# Patient Record
Sex: Male | Born: 1950 | Race: Black or African American | Hispanic: No | Marital: Married | Smoking: Never smoker
Health system: Southern US, Community
[De-identification: ages and names within clinical notes are randomized; demographics above are authoritative.]

## PROBLEM LIST (undated history)

## (undated) DIAGNOSIS — I10 Essential (primary) hypertension: Secondary | ICD-10-CM

## (undated) DIAGNOSIS — E119 Type 2 diabetes mellitus without complications: Secondary | ICD-10-CM

## (undated) DIAGNOSIS — G8929 Other chronic pain: Secondary | ICD-10-CM

## (undated) DIAGNOSIS — M549 Dorsalgia, unspecified: Secondary | ICD-10-CM

## (undated) DIAGNOSIS — M1990 Unspecified osteoarthritis, unspecified site: Secondary | ICD-10-CM

---

## 2003-11-15 ENCOUNTER — Emergency Department (HOSPITAL_COMMUNITY): Admission: EM | Admit: 2003-11-15 | Discharge: 2003-11-15 | Payer: Self-pay | Admitting: Emergency Medicine

## 2003-11-16 ENCOUNTER — Emergency Department (HOSPITAL_COMMUNITY): Admission: EM | Admit: 2003-11-16 | Discharge: 2003-11-16 | Payer: Self-pay | Admitting: Emergency Medicine

## 2010-10-02 ENCOUNTER — Inpatient Hospital Stay (HOSPITAL_COMMUNITY)
Admission: EM | Admit: 2010-10-02 | Discharge: 2010-10-05 | DRG: 552 | Disposition: A | Discharge status: Home / Self Care | Payer: Self-pay | Attending: Internal Medicine | Admitting: Internal Medicine

## 2010-10-02 ENCOUNTER — Emergency Department (HOSPITAL_COMMUNITY): Payer: Self-pay

## 2010-10-02 ENCOUNTER — Encounter (HOSPITAL_COMMUNITY): Payer: Self-pay

## 2010-10-02 DIAGNOSIS — E871 Hypo-osmolality and hyponatremia: Secondary | ICD-10-CM | POA: Diagnosis present

## 2010-10-02 DIAGNOSIS — I10 Essential (primary) hypertension: Secondary | ICD-10-CM | POA: Diagnosis present

## 2010-10-02 DIAGNOSIS — Z91199 Patient's noncompliance with other medical treatment and regimen due to unspecified reason: Secondary | ICD-10-CM

## 2010-10-02 DIAGNOSIS — Z5900 Homelessness unspecified: Secondary | ICD-10-CM

## 2010-10-02 DIAGNOSIS — E876 Hypokalemia: Secondary | ICD-10-CM | POA: Diagnosis present

## 2010-10-02 DIAGNOSIS — D649 Anemia, unspecified: Secondary | ICD-10-CM | POA: Diagnosis present

## 2010-10-02 DIAGNOSIS — D696 Thrombocytopenia, unspecified: Secondary | ICD-10-CM | POA: Diagnosis present

## 2010-10-02 DIAGNOSIS — S32009A Unspecified fracture of unspecified lumbar vertebra, initial encounter for closed fracture: Principal | ICD-10-CM | POA: Diagnosis present

## 2010-10-02 DIAGNOSIS — S0003XA Contusion of scalp, initial encounter: Secondary | ICD-10-CM | POA: Diagnosis present

## 2010-10-02 DIAGNOSIS — W11XXXA Fall on and from ladder, initial encounter: Secondary | ICD-10-CM | POA: Diagnosis present

## 2010-10-02 DIAGNOSIS — Z9119 Patient's noncompliance with other medical treatment and regimen: Secondary | ICD-10-CM

## 2010-10-02 DIAGNOSIS — E538 Deficiency of other specified B group vitamins: Secondary | ICD-10-CM | POA: Diagnosis present

## 2010-10-02 DIAGNOSIS — S0083XA Contusion of other part of head, initial encounter: Secondary | ICD-10-CM | POA: Diagnosis present

## 2010-10-02 HISTORY — DX: Essential (primary) hypertension: I10

## 2010-10-02 LAB — CBC
MCHC: 33.7 g/dL (ref 30.0–36.0)
Platelets: 126 10*3/uL — ABNORMAL LOW (ref 150–400)
RDW: 12.9 % (ref 11.5–15.5)
WBC: 6.9 10*3/uL (ref 4.0–10.5)

## 2010-10-02 LAB — DIFFERENTIAL
Basophils Absolute: 0 10*3/uL (ref 0.0–0.1)
Basophils Relative: 0 % (ref 0–1)
Eosinophils Absolute: 0 10*3/uL (ref 0.0–0.7)
Eosinophils Relative: 0 % (ref 0–5)
Monocytes Absolute: 0.5 10*3/uL (ref 0.1–1.0)

## 2010-10-02 LAB — POCT I-STAT, CHEM 8
Creatinine, Ser: 0.8 mg/dL (ref 0.50–1.35)
Glucose, Bld: 137 mg/dL — ABNORMAL HIGH (ref 70–99)
HCT: 39 % (ref 39.0–52.0)
Hemoglobin: 13.3 g/dL (ref 13.0–17.0)
TCO2: 21 mmol/L (ref 0–100)

## 2010-10-02 MED ORDER — HYDROMORPHONE HCL 1 MG/ML IJ SOLN
INTRAMUSCULAR | Status: AC
  Filled 2010-10-02: qty 1

## 2010-10-02 MED ORDER — KETOROLAC TROMETHAMINE 30 MG/ML IJ SOLN
INTRAMUSCULAR | Status: AC
  Filled 2010-10-02: qty 1

## 2010-10-03 LAB — COMPREHENSIVE METABOLIC PANEL
ALT: 14 U/L (ref 0–53)
Alkaline Phosphatase: 49 U/L (ref 39–117)
Chloride: 105 mEq/L (ref 96–112)
GFR calc Af Amer: >60 mL/min (ref >60)
Glucose, Bld: 99 mg/dL (ref 70–99)
Potassium: 3.2 mEq/L — ABNORMAL LOW (ref 3.5–5.1)
Sodium: 137 mEq/L (ref 135–145)
Total Protein: 6.4 g/dL (ref 6.0–8.3)

## 2010-10-03 LAB — CBC
Hemoglobin: 11.7 g/dL — ABNORMAL LOW (ref 13.0–17.0)
MCHC: 33.7 g/dL (ref 30.0–36.0)
RBC: 4.11 MIL/uL — ABNORMAL LOW (ref 4.22–5.81)
WBC: 3.9 10*3/uL — ABNORMAL LOW (ref 4.0–10.5)

## 2010-10-03 LAB — DIFFERENTIAL
Basophils Absolute: 0 10*3/uL (ref 0.0–0.1)
Basophils Relative: 0 % (ref 0–1)
Monocytes Absolute: 0.4 10*3/uL (ref 0.1–1.0)
Neutro Abs: 2.8 10*3/uL (ref 1.7–7.7)
Neutrophils Relative %: 72 % (ref 43–77)

## 2010-10-04 LAB — FOLATE: Folate: 9.2 ng/mL

## 2010-10-04 LAB — BASIC METABOLIC PANEL
BUN: 8 mg/dL (ref 6–23)
Calcium: 8.8 mg/dL (ref 8.4–10.5)
GFR calc non Af Amer: >60 mL/min (ref >60)
Glucose, Bld: 119 mg/dL — ABNORMAL HIGH (ref 70–99)
Sodium: 132 mEq/L — ABNORMAL LOW (ref 135–145)

## 2010-10-04 LAB — CBC
HCT: 39.8 % (ref 39.0–52.0)
Hemoglobin: 13.7 g/dL (ref 13.0–17.0)
MCH: 28.5 pg (ref 26.0–34.0)
MCHC: 34.4 g/dL (ref 30.0–36.0)

## 2010-10-04 LAB — FERRITIN: Ferritin: 791 ng/mL — ABNORMAL HIGH (ref 22–322)

## 2010-10-08 NOTE — Consult Note (Signed)
NAMEMarland Roberson  ARAD, BURSTON NO.:  [id number]  MEDICAL RECORD NO.:  [id number]  LOCATION:  5039                         FACILITY:  [facility]  PHYSICIAN:  Stefani Dama, M.D.  DATE OF BIRTH:  1950-04-23  DATE OF CONSULTATION:  10/02/2010 DATE OF DISCHARGE:                                CONSULTATION   REASON FOR REQUEST:  L2 compression fracture.  HISTORY OF PRESENT ILLNESS:  Mr. Jerry Roberson is a 60 year old homeless black male who works as a Education administrator.  He was on a ladder doing some paint work when he was attempting to move his ladder over a bit and he fell from the ladder distance of approximately 12 feet.  He describes landing squared on his buttocks.  He experienced severe and sudden excruciating pain in the midportion of his back.  The pain to his breath away and ultimately, he was helped by some coworkers and EMS was summoned.  He was brought to the emergency department where radiographs and a CT scan of lumbar spine demonstrates an L2 burst fracture with minimum component of compression of approximately 15%.  There is only slight indentation of some bone into the spinal canal less than 10% of the canal was occupied.  The patient initially felt that his legs were weakened but this was largely due to the severity of the pain.  He now notes that after some pain medication, he can move his legs fairly well and fairly comfortably, but any rolling to the side or attempts at weightbearing can aggravate back pain severely.  PAST MEDICAL HISTORY:  Reveals that the patient does not see a doctor regularly.  He is hypertensive on this admission with a blood pressure 170/105 and he notes in the past he had been advised to take some medication for this process.  He is homeless by choice.  They note that he has been functioning this way since 1994.  SOCIAL HISTORY:  As noted above.  He is homeless by choice.  PHYSICAL EXAMINATION:  GENERAL:  Reveals that he is alert and  oriented individual.  His pupils are 3 mm briskly reactive to light and accommodation.  Extraocular movements are full.  Face symmetric. MUSCULOSKELETAL:  Motor function lower extremities reveals that he has good strength in iliopsoas and quadriceps, tibialis anterior, and gastrocs.  By confrontation, straight leg raising is positive for back pain at 30 degrees.  Patrick maneuver is negative.  Sensation intact distally to pin and light touch.  Reflexes are 2+ and symmetric in the patellae and the Achilles.  The upper extremity reflexes and strength are within limits of normal.  Cranial nerve examination is within limits of normal.  IMPRESSION:  The patient has evidence of L2 burst-type fracture.  This will be characterized as stable burst and given the minimum collapse of the anterior element, I believe that this would be best treated conservatively with a thoracolumbar and sacral orthosis.  We can contact the orthopedic technicians to have an appropriate brace fitted for the individual.  He can then gradually be mobilized out of bed andambulated and this can be followed on outpatient basis with repeat radiographs in approximately 3 weeks time.  The patient  will likely need to undergo further workup and evaluation of hypertension and if he is admitted, we will follow along conservatively.     Stefani Dama, M.D.     Merla Riches  D:  10/03/2010  T:  10/03/2010  Job:  [id number]  Electronically Signed by Barnett Abu M.D. on 10/08/2010 07:32:50 AM

## 2010-10-10 NOTE — H&P (Signed)
NAMEMarland Kitchen  Jerry Roberson, Jerry Roberson              ACCOUNT NO.:  [id number]  MEDICAL RECORD NO.:  [id number]  LOCATION:  [facility]                         FACILITY:  [facility]  PHYSICIAN:  Jeoffrey Massed, MD    DATE OF BIRTH:  1950-08-22  DATE OF ADMISSION:  10/02/2010 DATE OF DISCHARGE:                             HISTORY & PHYSICAL   PRIMARY CARE PRACTITIONER:  Does not have one.  HISTORY OF PRESENT ILLNESS:  The patient is a 60 year old homeless black male with a past medical history of hypertension, noncompliant to medications was painting a indoor of a building when he lost his balance, the ladder became unsteady and he fell on his lower back and buttock area.  He then started having excruciating pain in his lower back area.  He then was brought to the ED for further evaluation. Numerous CAT scan of his back with done and a lumbar CAT scan shows a L2 burst fracture with minimal retropulsion along with L4-L5 severe central canal stenosis.  He has already been seen by Neurosurgery, who have recommended conservative medical management along with a TLSO brace and pain control.  Interestingly in the ED, he was noted to have extremely high blood pressures and as a resultant has been referred to the hospitalist service for admission for further evaluation and management. Please note that this patient denies that the fall that he had today was a syncopal episode.  He denies any ongoing headache, chest pain, shortness of breath, palpitations, nausea, vomiting or diarrhea.  ALLERGIES:  The patient denies any known allergies.  PAST MEDICAL HISTORY:  Hypertension, noncompliant to medications.  PAST SURGICAL HISTORY:  None.  FAMILY HISTORY:  Noncontributory.  SOCIAL HISTORY:  Denies any toxic habits.  He is a homeless person.  He was previously in the [employer].  He has three sons in the [place] area and a sister in [place] and is only on occasion in touch with them.  REVIEW OF SYSTEMS:  A  detailed review of 12 systems was done and these are negative except for the ones noted in the HPI.  PHYSICAL EXAMINATION:  GENERAL:  Lying in bed, does not appear to be in any distress, is awake and alert, speech is clear. VITAL SIGNS:  Initial vital signs showed a temperature of 96.9, heart rate of 79, blood pressure of 197/114, respiratory rate of 20 and a pulse ox of 94% on room air. HEENT:  Atraumatic, normocephalic.  Pupils are equally reactive to light and accommodation. NECK:  Supple. CHEST:  Bilaterally clear to auscultation. CARDIOVASCULAR:  Heart sounds are regular.  No murmurs heard. ABDOMEN:  Soft, nontender, nondistended. EXTREMITIES:  No edema. NEUROLOGIC:  The patient is awake and alert.  Speech is clear.  Cranial nerves from II through XII are intact.  On examination of his strength, he appears to have 5/5 strength in all of his four extremities. Sensation is grossly intact.  Reflexes are 2+ and the plantars are downgoing bilaterally.  LABORATORY STUDIES: 1. CBC shows a WBC of 6.9, hemoglobin of 11.7, and a platelet count of     126. 2. I-STAT chemistry shows a sodium of 139, potassium of 3.6, chloride  of 111, glucose of 137, BUN of 10, and creatinine of 0.80.  RADIOLOGICAL STUDIES: 1. CT of the head shows a right frontal scalp hematoma.  No acute     intracranial finding. 2. CT of the cervical spine shows no acute fracture or malalignment,     C3-C6 spondylosis and degenerative disk disease. 3. CT of the lumbar spine shows an acute L2 burst fracture with 3-mm     of retropulsion.  No epidural hematoma identified and minimal     impression of the thecal sac at L2.  L4-L5 moderate-to-severe     central canal stenosis with central and left paracentral disk     extrusion which may be acute, traumatic, or chronic.  Correlation     with clinical exam is recommended.  MRI may be useful for further     characterization.  L5-S1 degenerative disk disease and      circumferential disk bulge. 4. X-ray of the pelvis shows no acute findings.  ASSESSMENT: 1. Uncontrolled hypertension secondary to noncompliance to medications     and probably as a response to pain as well. 2. L2 burst fracture with no evidence of any focal neurological     deficits.  PLAN: 1. The patient will be admitted to the hospitalist service to a     regular medical bed. 2. In regards to his L2 burst fracture, Neurosurgery has already seen     him and they have recommended a TLSO brace, they are also     recommended to mobilize with brace on.  They have recommended pain     control and outpatient followup with Dr. Danielle Dess in 2-4 weeks. 3. In regards to his hypertension, we will start him on     hydrochlorothiazide and lisinopril and titrate as necessary.     Hydralazine will be used on a p.r.n. basis. 4. DVT prophylaxis with Lovenox. 5. Further plan will depend as the patient's clinical course evolves. 6. Physical therapy services will be obtained in the morning per     suggestion of neurosurgery. 7. Code status is full code.  Total time spent for admission 45 minutes.     Jeoffrey Massed, MD    SG/MEDQ  D:  10/02/2010  T:  10/02/2010  Job:  [id number]  Electronically Signed by Jeoffrey Massed  on 10/10/2010 08:29:40 AM

## 2010-11-07 NOTE — Discharge Summary (Signed)
Jerry Roberson, Jerry Roberson              ACCOUNT NO.:  [id number]  MEDICAL RECORD NO.:  [id number]  LOCATION:  5039                         FACILITY:  [facility]  PHYSICIAN:  Ladell Pier, M.D.   DATE OF BIRTH:  [birth year]  DATE OF ADMISSION:  10/02/2010 DATE OF DISCHARGE:  10/05/2010                              DISCHARGE SUMMARY   DISCHARGE DIAGNOSES: 1. Hypertension, uncontrolled. 2. L2 burst fracture with no evidence of any focal or neurologic     deficits. 3. Hypokalemia that is now resolved. 4. Anemia secondary to vitamin B12 deficiency. 5. Vitamin B12 deficiency. 6. Scalp hematoma. 7. Mild thrombocytopenia. 8. Mild hyponatremia.  DISCHARGE MEDICATIONS: 1. Oxycodone 5 mg every 4 hours as needed for pain. 2. HCTZ 25 mg daily. 3. Lisinopril 40 mg daily. 4. Vitamin B12 1000 mcg daily.  FOLLOWUP APPOINTMENTS:  The patient is to follow up with [facility] on August 27 at 10:30 a.m. and July 17 at 10:45 a.m.  The patient is to call for an appointment with Dr. Danielle Dess, neurosurgery at [phone].  The patient to follow up in 3 weeks.  PROCEDURES: 1. CT of the lumbar spine showed acute L2 burst fracture with 3 mm of     retropulsion.  No epidural hematoma identified and a minimal     impression on the thecal sac at L2, L4-L5 moderate to severe     central stenosis associated with central and left paracentral disk     extrusion which may be acute traumatic or chronic correlation where     clinical exam is recommended.  L5-S1 degenerative disk disease with     circumferential disk bulge.  CT of the head, right frontal scalp     hematoma, no acute intracranial abnormality. 2. CT of the pelvis, no acute findings.  [facility]:  Stefani Dama, MD  HISTORY OF PRESENT ILLNESS:  The patient is a 60 year old African American male with past medical history significant for hypertension that is noncompliant with medications.  The patient stated he was painting an indoor of a  building when he lost his balance and ladder became unsteady and he fell on his lower back and buttock area.  He then started having excruciating pain in his lower back.  He was brought to the emergency for further evaluation.  CT done in the emergency room with the L2 burst fracture with minimal retropulsion of L4-L5, severe central canal stenosis.  He was already been seen by neurosurgery who has recommended conservative medical management.  He was in TLSO brace and pain control.  In the ED he was noted to have extremely high blood pressure, so we were called to admit for further management.  Past medical history, family history, social history, meds, allergies as per admission H and P.  PHYSICAL EXAM:  At the time of discharge; VITAL SIGNS:  Temperature 97.7, pulse 83, respirations 18, blood pressure 161/94, pulse of 100% on room air. GENERAL:  The patient is lying on the bed, well-nourished African American male. HEENT:  Normocephalic, atraumatic.  Pupils are reactive to light. Throat without erythema. CARDIOVASCULAR:  Regular rate and rhythm. LUNGS:  Clear bilaterally. ABDOMEN:  Positive bowel sounds. EXTREMITIES:  No edema.  NEUROLOGIC:  Nonfocal.  HOSPITAL COURSE: 1. Hypertension:  The patient was started on HCTZ and lisinopril and     the medications are both be titrated up.  The patient will be     discharged on that regimen and he will follow up with [facility].     He does not have any headache or vision changes or chest pain. 2. L2 burst fracture.  The patient was seen by neurosurgery and he has     been fitted with a brace per ortho tech.  He was instructed per     ortho tech and neurosurgery on using the back brace.  He will call     to schedule an appointment with Dr. Danielle Dess for 3 weeks followup.     The patient told to get a repeat x-ray per Dr. Verlee Rossetti note. 3. Hypokalemia:  The patient was on the low potassium that was     repleted. 4. Macrocytic anemia.  The  patient had B12 and folate levels checked.     Vitamin B12 level was low.  He will receive 1000 mcg of vitamin B12     IM prior to being discharged and then he will be placed on oral     vitamin B12 as an outpatient.  Since he is homeless, it would be     difficult for [place] to arrange for him to have routine IM injections     but this can be addressed when he follows up with [facility].  Labs at the time of discharge; iron is 58, TIBC 250, percent sat 23, vitamin B12 193, folate of 9.2.  Sodium 132, potassium 3.6, chloride 97, CO2 25, glucose 119, BUN 8, creatinine 0.76, WBC 4.5, hemoglobin 13.7, MCV 82.7, platelet of 145.  [name] remained stable throughout the hospitalization and no further intervention was done as far as the sodium and potassium.  Time spent with the patient and doing discharge approximately 35 minutes.     Ladell Pier, M.D.     [place]/MEDQ  D:  10/05/2010  T:  10/05/2010  Job:  [id number]  Electronically Signed by Ladell Pier M.D. on 11/07/2010 10:15:59 PM

## 2011-12-30 ENCOUNTER — Encounter (HOSPITAL_COMMUNITY): Payer: Self-pay | Admitting: *Deleted

## 2011-12-30 ENCOUNTER — Emergency Department (HOSPITAL_COMMUNITY): Payer: Self-pay

## 2011-12-30 ENCOUNTER — Emergency Department (HOSPITAL_COMMUNITY)
Admission: EM | Admit: 2011-12-30 | Discharge: 2011-12-30 | Disposition: A | Discharge status: Home Health | Payer: Self-pay | Attending: Emergency Medicine | Admitting: Emergency Medicine

## 2011-12-30 DIAGNOSIS — Y939 Activity, unspecified: Secondary | ICD-10-CM | POA: Insufficient documentation

## 2011-12-30 DIAGNOSIS — R209 Unspecified disturbances of skin sensation: Secondary | ICD-10-CM | POA: Insufficient documentation

## 2011-12-30 DIAGNOSIS — Z87828 Personal history of other (healed) physical injury and trauma: Secondary | ICD-10-CM | POA: Insufficient documentation

## 2011-12-30 DIAGNOSIS — Y929 Unspecified place or not applicable: Secondary | ICD-10-CM | POA: Insufficient documentation

## 2011-12-30 DIAGNOSIS — M549 Dorsalgia, unspecified: Secondary | ICD-10-CM

## 2011-12-30 DIAGNOSIS — W19XXXA Unspecified fall, initial encounter: Secondary | ICD-10-CM | POA: Insufficient documentation

## 2011-12-30 DIAGNOSIS — IMO0002 Reserved for concepts with insufficient information to code with codable children: Secondary | ICD-10-CM | POA: Insufficient documentation

## 2011-12-30 DIAGNOSIS — I10 Essential (primary) hypertension: Secondary | ICD-10-CM | POA: Insufficient documentation

## 2011-12-30 LAB — POCT I-STAT, CHEM 8
BUN: 13 mg/dL (ref 6–23)
Creatinine, Ser: 1.2 mg/dL (ref 0.50–1.35)
Potassium: 3.8 mEq/L (ref 3.5–5.1)
Sodium: 142 mEq/L (ref 135–145)
TCO2: 26 mmol/L (ref 0–100)

## 2011-12-30 MED ORDER — SODIUM CHLORIDE 0.9 % IV SOLN
INTRAVENOUS | Status: DC
  Administered 2011-12-30: 18:00:00 via INTRAVENOUS

## 2011-12-30 MED ORDER — MORPHINE SULFATE 2 MG/ML IJ SOLN
2.0000 mg | Freq: Once | INTRAMUSCULAR | Status: AC
  Administered 2011-12-30: 2 mg via INTRAVENOUS
  Filled 2011-12-30: qty 1

## 2011-12-30 MED ORDER — KETOROLAC TROMETHAMINE 30 MG/ML IJ SOLN
30.0000 mg | Freq: Once | INTRAMUSCULAR | Status: AC
  Administered 2011-12-30: 30 mg via INTRAVENOUS
  Filled 2011-12-30: qty 1

## 2011-12-30 MED ORDER — LABETALOL HCL 5 MG/ML IV SOLN
20.0000 mg | Freq: Once | INTRAVENOUS | Status: AC
  Administered 2011-12-30: 20 mg via INTRAVENOUS
  Filled 2011-12-30: qty 4

## 2011-12-30 MED ORDER — KETOROLAC TROMETHAMINE 30 MG/ML IJ SOLN: 30.0000 mg | Freq: Once | INTRAMUSCULAR | Status: DC

## 2011-12-30 MED ORDER — LISINOPRIL 20 MG PO TABS: 20.0000 mg | ORAL_TABLET | Freq: Every day | ORAL | Status: AC

## 2011-12-30 MED ORDER — TRAMADOL HCL 50 MG PO TABS
50.0000 mg | ORAL_TABLET | Freq: Once | ORAL | Status: AC
  Administered 2011-12-30: 50 mg via ORAL
  Filled 2011-12-30: qty 1

## 2011-12-30 MED ORDER — TRAMADOL HCL 50 MG PO TABS: 50.0000 mg | ORAL_TABLET | Freq: Four times a day (QID) | ORAL | Status: DC | PRN

## 2011-12-30 NOTE — ED Notes (Signed)
Pt states that he fell off of a bucket while sleeping and has had increasing lower back pain since the incident. States the fall was from approximately 2 feet. Pt has a hx of lumbar fracture. Now c/o lower back pain 10/10 that radiates down his left leg. He also has tingling of his left leg and decreased sensation in left foot.

## 2011-12-30 NOTE — ED Notes (Signed)
Pt states since last week was sleeping on a 5 gallon bucket and fell off and now with pain to lower back and down his left leg and feels like leg is numb.  PT broke his lower back one year ago and concerned that he may have reinjured it

## 2011-12-30 NOTE — ED Notes (Signed)
MD at bedside. 

## 2011-12-30 NOTE — ED Provider Notes (Addendum)
History     CSN: [id number]  Arrival date & time 12/30/11  1426   First MD Initiated Contact with Patient 12/30/11 1713      Chief Complaint  Patient presents with  . Back Pain    (Consider location/radiation/quality/duration/timing/severity/associated sxs/prior treatment) Patient is a 61 y.o. male presenting with back pain. The history is provided by the patient.  Back Pain  Associated symptoms include numbness. Pertinent negatives include no dysuria and no weakness.  he has hx of back injury.  A year ago after he fell off a ladder.  Approximately one week ago.  He was sitting on a barrel and he fell off of that since then.  He has had a lower back pain, which she states radiates down.  His left leg, to his foot.  He denies weakness.  He denies any other pain or symptoms.  Past Medical History  Diagnosis Date  . Hypertension     History reviewed. No pertinent past surgical history.  No family history on file.  History  Substance Use Topics  . Smoking status: Never Smoker   . Smokeless tobacco: Not on file  . Alcohol Use: No      Review of Systems  Genitourinary: Negative for dysuria.  Musculoskeletal: Positive for back pain.  Neurological: Positive for numbness. Negative for weakness.  Hematological: Does not bruise/bleed easily.  Psychiatric/Behavioral: Negative for confusion.  All other systems reviewed and are negative.    Allergies  Review of patient's allergies indicates no known allergies.  Home Medications  No current outpatient prescriptions on file.  BP 214/125  Pulse 78  Temp 97.6 F (36.4 C) (Oral)  Resp 16  SpO2 100%  Physical Exam  Constitutional: He is oriented to person, place, and time. He appears well-developed and well-nourished. No distress.  HENT:  Head: Normocephalic and atraumatic.  Eyes: Conjunctivae normal and EOM are normal.  Neck: Normal range of motion. Neck supple.  Cardiovascular: Normal rate, regular rhythm and intact  distal pulses.   No murmur heard. Pulmonary/Chest: Effort normal and breath sounds normal. No respiratory distress.  Abdominal: Soft. Bowel sounds are normal. There is no tenderness.  Musculoskeletal: Normal range of motion. He exhibits tenderness. He exhibits no edema.       No spinal column tenderness.  Mild tenderness in the left lumbar paravertebral area.  No ecchymoses, or swelling.  Neurological: He is alert and oriented to person, place, and time. No cranial nerve deficit.       Strength 5 over 5 in bilateral lower extremities  Skin: Skin is warm and dry.  Psychiatric: He has a normal mood and affect. Thought content normal.    ED Course  Procedures (including critical care time)  Labs Reviewed - No data to display No results found.   No diagnosis found.    MDM  Back pain, and hypertension.  No evidence of fracture or dislocation.  No evidence of neurological deficit. No end organ damage        Cheri Guppy, MD 12/30/11 1816  Cheri Guppy, MD 12/30/11 Barry Brunner

## 2011-12-30 NOTE — ED Notes (Signed)
Manual B/P 202/120.  Notified RN Adrian Prows

## 2012-08-03 ENCOUNTER — Emergency Department (HOSPITAL_COMMUNITY): Payer: Self-pay

## 2012-08-03 ENCOUNTER — Other Ambulatory Visit: Payer: Self-pay

## 2012-08-03 ENCOUNTER — Encounter (HOSPITAL_COMMUNITY): Payer: Self-pay | Admitting: Vascular Surgery

## 2012-08-03 ENCOUNTER — Emergency Department (HOSPITAL_COMMUNITY)
Admission: EM | Admit: 2012-08-03 | Discharge: 2012-08-03 | Disposition: A | Discharge status: Home / Self Care | Payer: Self-pay | Attending: Emergency Medicine | Admitting: Emergency Medicine

## 2012-08-03 DIAGNOSIS — Z8739 Personal history of other diseases of the musculoskeletal system and connective tissue: Secondary | ICD-10-CM | POA: Insufficient documentation

## 2012-08-03 DIAGNOSIS — S20229A Contusion of unspecified back wall of thorax, initial encounter: Secondary | ICD-10-CM | POA: Insufficient documentation

## 2012-08-03 DIAGNOSIS — S060X9A Concussion with loss of consciousness of unspecified duration, initial encounter: Secondary | ICD-10-CM | POA: Insufficient documentation

## 2012-08-03 DIAGNOSIS — Z79899 Other long term (current) drug therapy: Secondary | ICD-10-CM | POA: Insufficient documentation

## 2012-08-03 DIAGNOSIS — S0100XA Unspecified open wound of scalp, initial encounter: Secondary | ICD-10-CM | POA: Insufficient documentation

## 2012-08-03 DIAGNOSIS — I10 Essential (primary) hypertension: Secondary | ICD-10-CM | POA: Insufficient documentation

## 2012-08-03 DIAGNOSIS — S0101XA Laceration without foreign body of scalp, initial encounter: Secondary | ICD-10-CM

## 2012-08-03 DIAGNOSIS — S20222A Contusion of left back wall of thorax, initial encounter: Secondary | ICD-10-CM

## 2012-08-03 DIAGNOSIS — Z23 Encounter for immunization: Secondary | ICD-10-CM | POA: Insufficient documentation

## 2012-08-03 HISTORY — DX: Unspecified osteoarthritis, unspecified site: M19.90

## 2012-08-03 MED ORDER — TETANUS-DIPHTH-ACELL PERTUSSIS 5-2.5-18.5 LF-MCG/0.5 IM SUSP
0.5000 mL | Freq: Once | INTRAMUSCULAR | Status: AC
  Administered 2012-08-03: 0.5 mL via INTRAMUSCULAR
  Filled 2012-08-03: qty 0.5

## 2012-08-03 MED ORDER — ACETAMINOPHEN 325 MG PO TABS
650.0000 mg | ORAL_TABLET | Freq: Once | ORAL | Status: DC
  Filled 2012-08-03: qty 2

## 2012-08-03 MED ORDER — HYDROMORPHONE HCL PF 1 MG/ML IJ SOLN
1.0000 mg | Freq: Once | INTRAMUSCULAR | Status: AC
  Administered 2012-08-03: 1 mg via INTRAVENOUS
  Filled 2012-08-03: qty 1
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
## 2012-08-03 NOTE — ED Provider Notes (Signed)
Date: 08/03/2012  Rate: 93  Rhythm: normal sinus rhythm  QRS Axis: normal  Intervals: normal  ST/T Wave abnormalities: normal  Conduction Disutrbances: none  Narrative Interpretation:   Old EKG Reviewed: No significant changes noted     Lyanne Co, MD 08/03/12 1851

## 2012-08-05 NOTE — ED Provider Notes (Signed)
I saw and evaluated the patient, reviewed the resident's note and I agree with the findings and plan.  I was present for the critical portions of the laceration repair.  Repeat abdominal exam is benign.  Discharge home in good condition.  Lyanne Co, MD 08/05/12 [phone]

## 2012-08-10 ENCOUNTER — Inpatient Hospital Stay (HOSPITAL_COMMUNITY)
Admission: EM | Admit: 2012-08-10 | Discharge: 2012-08-13 | DRG: 958 | Disposition: A | Discharge status: Home / Self Care | Payer: MEDICAID | Attending: Interventional Radiology | Admitting: Interventional Radiology

## 2012-08-10 ENCOUNTER — Emergency Department (HOSPITAL_COMMUNITY): Payer: Self-pay

## 2012-08-10 ENCOUNTER — Encounter (HOSPITAL_COMMUNITY): Payer: Self-pay | Admitting: Neurology

## 2012-08-10 DIAGNOSIS — I10 Essential (primary) hypertension: Secondary | ICD-10-CM | POA: Insufficient documentation

## 2012-08-10 DIAGNOSIS — S060X0A Concussion without loss of consciousness, initial encounter: Secondary | ICD-10-CM | POA: Diagnosis present

## 2012-08-10 DIAGNOSIS — S2242XA Multiple fractures of ribs, left side, initial encounter for closed fracture: Secondary | ICD-10-CM

## 2012-08-10 DIAGNOSIS — D649 Anemia, unspecified: Secondary | ICD-10-CM

## 2012-08-10 DIAGNOSIS — S2249XA Multiple fractures of ribs, unspecified side, initial encounter for closed fracture: Secondary | ICD-10-CM | POA: Diagnosis present

## 2012-08-10 DIAGNOSIS — S32009A Unspecified fracture of unspecified lumbar vertebra, initial encounter for closed fracture: Secondary | ICD-10-CM | POA: Diagnosis present

## 2012-08-10 DIAGNOSIS — I959 Hypotension, unspecified: Secondary | ICD-10-CM | POA: Diagnosis present

## 2012-08-10 DIAGNOSIS — S3609XA Other injury of spleen, initial encounter: Principal | ICD-10-CM | POA: Diagnosis present

## 2012-08-10 DIAGNOSIS — M129 Arthropathy, unspecified: Secondary | ICD-10-CM | POA: Diagnosis present

## 2012-08-10 DIAGNOSIS — Z79899 Other long term (current) drug therapy: Secondary | ICD-10-CM

## 2012-08-10 DIAGNOSIS — S0101XA Laceration without foreign body of scalp, initial encounter: Secondary | ICD-10-CM

## 2012-08-10 DIAGNOSIS — F1410 Cocaine abuse, uncomplicated: Secondary | ICD-10-CM | POA: Insufficient documentation

## 2012-08-10 DIAGNOSIS — D62 Acute posthemorrhagic anemia: Secondary | ICD-10-CM | POA: Diagnosis present

## 2012-08-10 DIAGNOSIS — R55 Syncope and collapse: Secondary | ICD-10-CM | POA: Diagnosis present

## 2012-08-10 DIAGNOSIS — E119 Type 2 diabetes mellitus without complications: Secondary | ICD-10-CM | POA: Diagnosis present

## 2012-08-10 DIAGNOSIS — K922 Gastrointestinal hemorrhage, unspecified: Secondary | ICD-10-CM

## 2012-08-10 DIAGNOSIS — S3681XA Injury of peritoneum, initial encounter: Secondary | ICD-10-CM | POA: Diagnosis present

## 2012-08-10 DIAGNOSIS — K625 Hemorrhage of anus and rectum: Secondary | ICD-10-CM | POA: Diagnosis present

## 2012-08-10 DIAGNOSIS — M549 Dorsalgia, unspecified: Secondary | ICD-10-CM | POA: Diagnosis present

## 2012-08-10 DIAGNOSIS — D509 Iron deficiency anemia, unspecified: Secondary | ICD-10-CM | POA: Diagnosis present

## 2012-08-10 DIAGNOSIS — G8929 Other chronic pain: Secondary | ICD-10-CM | POA: Diagnosis present

## 2012-08-10 DIAGNOSIS — S060X9A Concussion with loss of consciousness of unspecified duration, initial encounter: Secondary | ICD-10-CM

## 2012-08-10 DIAGNOSIS — S3600XA Unspecified injury of spleen, initial encounter: Secondary | ICD-10-CM

## 2012-08-10 HISTORY — DX: Other chronic pain: G89.29

## 2012-08-10 HISTORY — DX: Type 2 diabetes mellitus without complications: E11.9

## 2012-08-10 HISTORY — DX: Dorsalgia, unspecified: M54.9

## 2012-08-10 LAB — CBC
MCH: 29 pg (ref 26.0–34.0)
Platelets: 167 10*3/uL (ref 150–400)
RBC: 2.21 MIL/uL — ABNORMAL LOW (ref 4.22–5.81)
RDW: 13.3 % (ref 11.5–15.5)
WBC: 8.2 10*3/uL (ref 4.0–10.5)

## 2012-08-10 LAB — COMPREHENSIVE METABOLIC PANEL WITH GFR
ALT: 12 U/L (ref 0–53)
AST: 14 U/L (ref 0–37)
Albumin: 3 g/dL — ABNORMAL LOW (ref 3.5–5.2)
Alkaline Phosphatase: 48 U/L (ref 39–117)
BUN: 15 mg/dL (ref 6–23)
CO2: 26 meq/L (ref 19–32)
Calcium: 8.6 mg/dL (ref 8.4–10.5)
Chloride: 106 meq/L (ref 96–112)
Creatinine, Ser: 1.31 mg/dL (ref 0.50–1.35)
GFR calc Af Amer: 67 mL/min — ABNORMAL LOW
GFR calc non Af Amer: 58 mL/min — ABNORMAL LOW
Glucose, Bld: 115 mg/dL — ABNORMAL HIGH (ref 70–99)
Potassium: 4 meq/L (ref 3.5–5.1)
Sodium: 141 meq/L (ref 135–145)
Total Bilirubin: 0.5 mg/dL (ref 0.3–1.2)
Total Protein: 6.9 g/dL (ref 6.0–8.3)

## 2012-08-10 LAB — RETICULOCYTES
RBC.: 2.31 MIL/uL — ABNORMAL LOW (ref 4.22–5.81)
RBC.: 2.21 MIL/uL — ABNORMAL LOW (ref 4.22–5.81)
Retic Count, Absolute: 39.3 10*3/uL (ref 19.0–186.0)
Retic Count, Absolute: 39.8 10*3/uL (ref 19.0–186.0)
Retic Ct Pct: 1.7 % (ref 0.4–3.1)
Retic Ct Pct: 1.8 % (ref 0.4–3.1)

## 2012-08-10 LAB — FOLATE: Folate: 12.8 ng/mL

## 2012-08-10 LAB — CBC WITH DIFFERENTIAL/PLATELET
Basophils Relative: 0 % (ref 0–1)
Eosinophils Relative: 0 % (ref 0–5)
Hemoglobin: 6.7 g/dL — CL (ref 13.0–17.0)
Lymphs Abs: 0.6 10*3/uL — ABNORMAL LOW (ref 0.7–4.0)
MCV: 86.7 fL (ref 78.0–100.0)
Monocytes Relative: 7 % (ref 3–12)
Neutrophils Relative %: 85 % — ABNORMAL HIGH (ref 43–77)
Platelets: 169 10*3/uL (ref 150–400)
RBC: 2.33 MIL/uL — ABNORMAL LOW (ref 4.22–5.81)
WBC: 7 10*3/uL (ref 4.0–10.5)

## 2012-08-10 LAB — MRSA PCR SCREENING: MRSA by PCR: NEGATIVE

## 2012-08-10 LAB — HEMOGLOBIN A1C: Hgb A1c MFr Bld: 6 % — ABNORMAL HIGH (ref <5.7)

## 2012-08-10 LAB — PROTIME-INR
INR: 1.18 (ref 0.00–1.49)
Prothrombin Time: 14.8 seconds (ref 11.6–15.2)

## 2012-08-10 LAB — ABO/RH: ABO/RH(D): A POS

## 2012-08-10 LAB — TROPONIN I: Troponin I: <0.3 ng/mL (ref <0.30)

## 2012-08-10 LAB — VITAMIN B12: Vitamin B-12: 231 pg/mL (ref 211–911)

## 2012-08-10 LAB — IRON AND TIBC: Saturation Ratios: 6 % — ABNORMAL LOW (ref 20–55)

## 2012-08-10 LAB — PREPARE RBC (CROSSMATCH)

## 2012-08-10 MED ORDER — ONDANSETRON HCL 4 MG/2ML IJ SOLN
4.0000 mg | Freq: Four times a day (QID) | INTRAMUSCULAR | Status: DC | PRN
  Administered 2012-08-10: 4 mg via INTRAVENOUS
  Filled 2012-08-10: qty 2

## 2012-08-10 MED ORDER — SODIUM CHLORIDE 0.9 % IV SOLN
INTRAVENOUS | Status: DC
  Administered 2012-08-10 (×2): via INTRAVENOUS

## 2012-08-10 MED ORDER — DIPHENHYDRAMINE HCL 50 MG/ML IJ SOLN
25.0000 mg | INTRAMUSCULAR | Status: AC
  Administered 2012-08-10: 25 mg via INTRAVENOUS
  Filled 2012-08-10: qty 1

## 2012-08-10 MED ORDER — MORPHINE SULFATE 2 MG/ML IJ SOLN
1.0000 mg | INTRAMUSCULAR | Status: DC | PRN
  Administered 2012-08-10 – 2012-08-12 (×5): 1 mg via INTRAVENOUS
  Filled 2012-08-10 (×5): qty 1

## 2012-08-10 MED ORDER — ONDANSETRON HCL 4 MG PO TABS: 4.0000 mg | ORAL_TABLET | Freq: Four times a day (QID) | ORAL | Status: DC | PRN

## 2012-08-10 MED ORDER — SENNOSIDES-DOCUSATE SODIUM 8.6-50 MG PO TABS
1.0000 | ORAL_TABLET | Freq: Every evening | ORAL | Status: DC | PRN
  Filled 2012-08-10: qty 1

## 2012-08-10 MED ORDER — SODIUM CHLORIDE 0.9 % IV SOLN
INTRAVENOUS | Status: DC
  Administered 2012-08-10: 125 mL/h via INTRAVENOUS

## 2012-08-10 MED ORDER — ACETAMINOPHEN 325 MG PO TABS
650.0000 mg | ORAL_TABLET | Freq: Four times a day (QID) | ORAL | Status: DC | PRN
  Administered 2012-08-10: 650 mg via ORAL
  Filled 2012-08-10: qty 2
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
## 2012-08-10 NOTE — ED Notes (Signed)
Pt admitted to admitting physician to doing cocaine yesterday
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
## 2012-08-10 NOTE — Consult Note (Signed)
Reason for Consult: Anemia bright red blood per rectum Referring Physician: Hospital team  Jerry Roberson is an 62 y.o. male.  HPI: Patient seen at the request of the hospital team for bright red blood per rectum and anemia but he himself has not seen any blood and other than a ventral hernia which seems to move and a recent trauma with more rib pain than abdominal pain he does not have any GI complaints and has not had any previous GI problem but we do not have a recent CBC and his family history is negative but he has been on Naprosyn at home for a while  Past Medical History  Diagnosis Date  . Hypertension   . Arthritis   . Diabetes mellitus without complication   . Chronic back pain     History reviewed. No pertinent past surgical history.  No family history on file.  Social History:  reports that he has never smoked. He does not have any smokeless tobacco history on file. He reports that he does not drink alcohol or use illicit drugs.  Allergies: No Known Allergies  Medications: I have reviewed the patient's current medications.  Results for orders placed during the hospital encounter of 08/10/12 (from the past 48 hour(s))  CBC WITH DIFFERENTIAL     Status: Abnormal   Collection Time    08/10/12 12:45 PM      Result Value Range   WBC 7.0  4.0 - 10.5 K/uL   RBC 2.33 (*) 4.22 - 5.81 MIL/uL   Hemoglobin 6.7 (*) 13.0 - 17.0 g/dL   Comment: REPEATED TO VERIFY     CRITICAL RESULT CALLED TO, READ BACK BY AND VERIFIED WITH:     A.RANGLEY,RN 08/10/12 1304 BY BSLADE   HCT 20.2 (*) 39.0 - 52.0 %   MCV 86.7  78.0 - 100.0 fL   MCH 28.8  26.0 - 34.0 pg   MCHC 33.2  30.0 - 36.0 g/dL   RDW 62.1  30.8 - 65.7 %   Platelets 169  150 - 400 K/uL   Neutrophils Relative % 85 (*) 43 - 77 %   Lymphocytes Relative 8 (*) 12 - 46 %   Monocytes Relative 7  3 - 12 %   Eosinophils Relative 0  0 - 5 %   Basophils Relative 0  0 - 1 %   Neutro Abs 5.9  1.7 - 7.7 K/uL   Lymphs Abs 0.6 (*) 0.7 - 4.0  K/uL   Monocytes Absolute 0.5  0.1 - 1.0 K/uL   Eosinophils Absolute 0.0  0.0 - 0.7 K/uL   Basophils Absolute 0.0  0.0 - 0.1 K/uL   RBC Morphology ELLIPTOCYTES     Comment: POLYCHROMASIA PRESENT  COMPREHENSIVE METABOLIC PANEL     Status: Abnormal   Collection Time    08/10/12 12:45 PM      Result Value Range   Sodium 141  135 - 145 mEq/L   Potassium 4.0  3.5 - 5.1 mEq/L   Chloride 106  96 - 112 mEq/L   CO2 26  19 - 32 mEq/L   Glucose, Bld 115 (*) 70 - 99 mg/dL   BUN 15  6 - 23 mg/dL   Creatinine, Ser 8.46  0.50 - 1.35 mg/dL   Calcium 8.6  8.4 - 96.2 mg/dL   Total Protein 6.9  6.0 - 8.3 g/dL   Albumin 3.0 (*) 3.5 - 5.2 g/dL   AST 14  0 - 37 U/L  ALT 12  0 - 53 U/L   Alkaline Phosphatase 48  39 - 117 U/L   Total Bilirubin 0.5  0.3 - 1.2 mg/dL   GFR calc non Af Amer 58 (*) >90 mL/min   GFR calc Af Amer 67 (*) >90 mL/min   Comment:            The eGFR has been calculated     using the CKD EPI equation.     This calculation has not been     validated in all clinical     situations.     eGFR's persistently     <90 mL/min signify     possible Chronic Kidney Disease.  SAMPLE TO BLOOD BANK     Status: None   Collection Time    08/10/12 12:45 PM      Result Value Range   Blood Bank Specimen SAMPLE AVAILABLE FOR TESTING     Sample Expiration 08/11/2012    TYPE AND SCREEN     Status: None   Collection Time    08/10/12 12:45 PM      Result Value Range   ABO/RH(D) A POS     Antibody Screen NEG     Sample Expiration 08/13/2012     Unit Number [id number]     Blood Component Type RED CELLS,LR     Unit division 00     Status of Unit ALLOCATED     Transfusion Status OK TO TRANSFUSE     Crossmatch Result Compatible     Unit Number [id number]     Blood Component Type RED CELLS,LR     Unit division 00     Status of Unit ALLOCATED     Transfusion Status OK TO TRANSFUSE     Crossmatch Result Compatible     Unit Number [id number]     Blood Component Type RED CELLS,LR      Unit division 00     Status of Unit ALLOCATED     Transfusion Status OK TO TRANSFUSE     Crossmatch Result Compatible     Unit Number [id number]     Blood Component Type RED CELLS,LR     Unit division 00     Status of Unit ALLOCATED     Transfusion Status OK TO TRANSFUSE     Crossmatch Result Compatible    PREPARE RBC (CROSSMATCH)     Status: None   Collection Time    08/10/12 12:45 PM      Result Value Range   Order Confirmation ORDER PROCESSED BY BLOOD BANK    ABO/RH     Status: None   Collection Time    08/10/12 12:45 PM      Result Value Range   ABO/RH(D) A POS    RETICULOCYTES     Status: Abnormal   Collection Time    08/10/12  2:29 PM      Result Value Range   Retic Ct Pct 1.7  0.4 - 3.1 %   RBC. 2.31 (*) 4.22 - 5.81 MIL/uL   Retic Count, Manual 39.3  19.0 - 186.0 K/uL    Dg Abd Acute W/chest  08/10/2012   *RADIOLOGY REPORT*  Clinical Data: Left abdominal pain, GI bleed, dizziness  ACUTE ABDOMEN SERIES (ABDOMEN 2 VIEW & CHEST 1 VIEW)  Comparison: Chest radiograph dated 08/03/2012  Findings: Mild left basilar atelectasis / scarring.  Lungs otherwise clear.  No pleural effusion or pneumothorax.  The heart is normal in size.  Nonspecific  bowel gas pattern with prominent but nondilated loops of small bowel in the mid abdomen, possibly reflecting a small bowel enteritis.  No evidence of free air under the diaphragm on the upright view.  IMPRESSION: Mild left basilar scarring versus atelectasis.  No findings to suggest small bowel obstruction or free air.  Possible small bowel enteritis.   Original Report Authenticated By: Charline Bills, M.D.    ROS other than his recent trauma he denies other complaints Blood pressure 136/86, pulse 74, temperature 97.7 F (36.5 C), temperature source Oral, resp. rate 17, height 5\' 11"  (1.803 m), weight 101.2 kg (223 lb 1.7 oz), SpO2 100.00%. Physical Exam no acute distress lying comfortably in the bed exam pertinent for a small ventral  hernia abdomen is soft nontender otherwise labs reviewed computer reviewed  Assessment/Plan: Anemia with bright red blood per rectum although not seen by patient in the past Plan: Will allow clear liquids and decide tomorrow whether he needs a CT first to make sure there is not significant trauma related issues but otherwise will consider a colonoscopy and possibly even an endoscopy because of nonsteroidal use and please call me sooner if question or problem or if bleeding increases  Sharonne Ricketts E 08/10/2012, 4:58 PM

## 2012-08-10 NOTE — ED Notes (Signed)
Attempted report 

## 2012-08-10 NOTE — ED Notes (Signed)
Consulting Physician at the bedside.  

## 2012-08-10 NOTE — ED Notes (Signed)
Pt returned from xray
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
## 2012-08-10 NOTE — Progress Notes (Signed)
Patient received to room 3313 from ED. Patient was able to move self over to our bed. Vital signs obtained, patient is alert and oriented. Patient instructed on not to get out of bed w/o calling for assistance

## 2012-08-11 ENCOUNTER — Encounter (HOSPITAL_COMMUNITY): Payer: Self-pay | Admitting: Radiology

## 2012-08-11 ENCOUNTER — Inpatient Hospital Stay (HOSPITAL_COMMUNITY): Payer: MEDICAID

## 2012-08-11 DIAGNOSIS — M129 Arthropathy, unspecified: Secondary | ICD-10-CM

## 2012-08-11 DIAGNOSIS — D62 Acute posthemorrhagic anemia: Secondary | ICD-10-CM

## 2012-08-11 DIAGNOSIS — S3609XA Other injury of spleen, initial encounter: Secondary | ICD-10-CM

## 2012-08-11 DIAGNOSIS — F1410 Cocaine abuse, uncomplicated: Secondary | ICD-10-CM

## 2012-08-11 DIAGNOSIS — E119 Type 2 diabetes mellitus without complications: Secondary | ICD-10-CM

## 2012-08-11 DIAGNOSIS — S3681XA Injury of peritoneum, initial encounter: Secondary | ICD-10-CM

## 2012-08-11 DIAGNOSIS — S2249XA Multiple fractures of ribs, unspecified side, initial encounter for closed fracture: Secondary | ICD-10-CM

## 2012-08-11 DIAGNOSIS — S3600XA Unspecified injury of spleen, initial encounter: Secondary | ICD-10-CM

## 2012-08-11 DIAGNOSIS — R55 Syncope and collapse: Secondary | ICD-10-CM

## 2012-08-11 LAB — CBC
Hemoglobin: 8.4 g/dL — ABNORMAL LOW (ref 13.0–17.0)
MCH: 28.8 pg (ref 26.0–34.0)
MCHC: 34.3 g/dL (ref 30.0–36.0)
Platelets: 188 10*3/uL (ref 150–400)
RDW: 14.5 % (ref 11.5–15.5)

## 2012-08-11 LAB — COMPREHENSIVE METABOLIC PANEL
ALT: 10 U/L (ref 0–53)
AST: 16 U/L (ref 0–37)
Albumin: 2.8 g/dL — ABNORMAL LOW (ref 3.5–5.2)
Alkaline Phosphatase: 50 U/L (ref 39–117)
Calcium: 8.5 mg/dL (ref 8.4–10.5)
Potassium: 3.8 mEq/L (ref 3.5–5.1)
Sodium: 139 mEq/L (ref 135–145)
Total Protein: 7 g/dL (ref 6.0–8.3)

## 2012-08-11 LAB — OCCULT BLOOD X 1 CARD TO LAB, STOOL: Fecal Occult Bld: NEGATIVE

## 2012-08-11 LAB — TROPONIN I: Troponin I: <0.3 ng/mL (ref <0.30)

## 2012-08-11 MED ORDER — MIDAZOLAM HCL 2 MG/2ML IJ SOLN
INTRAMUSCULAR | Status: AC
  Filled 2012-08-11: qty 6

## 2012-08-11 MED ORDER — ACETAMINOPHEN 325 MG PO TABS
325.0000 mg | ORAL_TABLET | ORAL | Status: AC
  Administered 2012-08-11: 325 mg via ORAL
  Filled 2012-08-11: qty 1

## 2012-08-11 MED ORDER — MIDAZOLAM HCL 2 MG/2ML IJ SOLN
INTRAMUSCULAR | Status: DC | PRN
  Administered 2012-08-11: 1 mg via INTRAVENOUS
  Administered 2012-08-11: 2 mg via INTRAVENOUS

## 2012-08-11 MED ORDER — FENTANYL CITRATE 0.05 MG/ML IJ SOLN
INTRAMUSCULAR | Status: AC
  Filled 2012-08-11: qty 4

## 2012-08-11 MED ORDER — IOHEXOL 300 MG/ML  SOLN
150.0000 mL | Freq: Once | INTRAMUSCULAR | Status: AC | PRN
  Administered 2012-08-11: 50 mL via INTRA_ARTERIAL

## 2012-08-11 MED ORDER — IOHEXOL 300 MG/ML  SOLN
100.0000 mL | Freq: Once | INTRAMUSCULAR | Status: AC | PRN
  Administered 2012-08-11: 100 mL via INTRAVENOUS

## 2012-08-11 MED ORDER — FENTANYL CITRATE 0.05 MG/ML IJ SOLN
INTRAMUSCULAR | Status: DC | PRN
  Administered 2012-08-11 (×3): 50 ug via INTRAVENOUS
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
## 2012-08-11 NOTE — Progress Notes (Signed)
Patient has temperature  100.5 after receiving 1 unit of blood.Call placed to Maren Reamer NP and order received for premed of tylenol and benadryl before second unit of blood.

## 2012-08-11 NOTE — Progress Notes (Signed)
Utilization review completed.  
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
## 2012-08-11 NOTE — Progress Notes (Signed)
Donnamarie Poag, NP notified of patient's persistent fever despite pre-meds for second unit of blood. New order received for a second dose of Tylenol and to start second unit of blood. Call MD if fever is >101. Will continue to monitor.

## 2012-08-11 NOTE — Procedures (Signed)
Splenic arteriogram and embolization No complication No blood loss. See complete dictation in [facility].
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
## 2012-08-12 ENCOUNTER — Inpatient Hospital Stay (HOSPITAL_COMMUNITY): Payer: MEDICAID

## 2012-08-12 DIAGNOSIS — S2242XA Multiple fractures of ribs, left side, initial encounter for closed fracture: Secondary | ICD-10-CM

## 2012-08-12 DIAGNOSIS — D62 Acute posthemorrhagic anemia: Secondary | ICD-10-CM

## 2012-08-12 LAB — COMPREHENSIVE METABOLIC PANEL
Albumin: 2.8 g/dL — ABNORMAL LOW (ref 3.5–5.2)
BUN: 8 mg/dL (ref 6–23)
Calcium: 8.6 mg/dL (ref 8.4–10.5)
Chloride: 102 mEq/L (ref 96–112)
Creatinine, Ser: 1.03 mg/dL (ref 0.50–1.35)
Total Bilirubin: 0.8 mg/dL (ref 0.3–1.2)

## 2012-08-12 LAB — CBC
MCV: 84.4 fL (ref 78.0–100.0)
Platelets: 221 10*3/uL (ref 150–400)
RBC: 3.08 MIL/uL — ABNORMAL LOW (ref 4.22–5.81)
WBC: 9.8 10*3/uL (ref 4.0–10.5)

## 2012-08-12 MED ORDER — TRAMADOL HCL 50 MG PO TABS: 50.0000 mg | ORAL_TABLET | Freq: Four times a day (QID) | ORAL | Status: DC | PRN

## 2012-08-12 MED ORDER — LISINOPRIL 20 MG PO TABS
20.0000 mg | ORAL_TABLET | Freq: Every day | ORAL | Status: DC
  Administered 2012-08-12 – 2012-08-13 (×2): 20 mg via ORAL
  Filled 2012-08-12 (×2): qty 1

## 2012-08-12 MED ORDER — OXYCODONE-ACETAMINOPHEN 5-325 MG PO TABS
1.0000 | ORAL_TABLET | ORAL | Status: DC | PRN
  Administered 2012-08-12 – 2012-08-13 (×3): 1 via ORAL
  Filled 2012-08-12 (×3): qty 1

## 2012-08-12 MED ORDER — ENSURE COMPLETE PO LIQD
237.0000 mL | Freq: Two times a day (BID) | ORAL | Status: DC
  Administered 2012-08-13: 237 mL via ORAL

## 2012-08-12 NOTE — Progress Notes (Signed)
NUTRITION FOLLOW UP  DOCUMENTATION CODES  Per approved criteria   -Severe malnutrition in the context of chronic illness    Intervention:    Ensure Complete PO BID, each supplement provides 350 kcal and 13 grams of protein.  Nutrition Dx:   Malnutrition related to inadequate oral intake as evidenced by severe depletion of subcutaneous fat and muscle mass. Ongoing.  Goal:   Intake to meet >90% of estimated nutrition needs. Unmet.  Monitor:   PO intake, labs, weight trend.  Assessment:   S/P splenic arteriogram and embolization 6/10. Diet advanced to Regular today. Patient consumed 100% of lunch today. Patient would benefit from PO supplements to increase intake of protein and calories to promote repletion of nutrient stores. Patient requests vanilla flavored Ensure supplement.  Pt meets criteria for severe MALNUTRITION in the context of chronic illness as evidenced by severe depletion of subcutaneous fat and muscle mass. Also with 10% weight loss over an unknown amount of time.  Height: Ht Readings from Last 1 Encounters:  08/10/12 5\' 11"  (1.803 m)    Weight Status:   Wt Readings from Last 1 Encounters:  08/12/12 166 lb (75.297 kg)  Per stand-up scale.  Ideal Body Weight: 78.2 kg  % Ideal Body Weight: 96%  Usual Body Weight: 180-185 lb per patient  % Usual Body Weight: 90%  Re-estimated needs:  Kcal: 2300-2600  Protein: 115-130 gm  Fluid: 2.3-2.5 L  Skin: head lacerations  Diet Order: General   Intake/Output Summary (Last 24 hours) at 08/12/12 1439 Last data filed at 08/12/12 0400  Gross per 24 hour  Intake   1200 ml  Output   2225 ml  Net  -1025 ml    Last BM: 6/9   Labs:   Recent Labs Lab 08/10/12 1245 08/11/12 1220 08/12/12 0350  NA 141 139 137  K 4.0 3.8 4.3  CL 106 104 102  CO2 26 26 26   BUN 15 10 8   CREATININE 1.31 0.95 1.03  CALCIUM 8.6 8.5 8.6  GLUCOSE 115* 98 88    CBG (last 3)  No results found for this basename: GLUCAP,  in  the last 72 hours  Scheduled Meds: . lisinopril  20 mg Oral Daily    Continuous Infusions: None  Joaquin Courts, RD, LDN, CNSC Pager [phone] After Hours Pager [phone]

## 2012-08-12 NOTE — Progress Notes (Signed)
Patient is clinically stable and his hemoglobin is stable.  Can be transferred to the floor.  This patient has been seen and I agree with the findings and treatment plan.  Marta Lamas. Gae Bon, MD, FACS [phone] (pager) [phone] (direct pager) Trauma Surgeon

## 2012-08-12 NOTE — Progress Notes (Signed)
[facility] [place] TEAM 1 - Stepdown/ICU TEAM  Discussed case w/ Trauma Team.  No signif active medical issues at present not being addressed by Trauma Service.    [facility] will sign off.  Please call if we can assist in the care of this patient further.  Thanks,   Lonia Blood, MD [facility] Office  [phone] Pager [phone]  On-Call/Text Page:      [email or web address]      password [facility]
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
## 2012-08-13 DIAGNOSIS — M1990 Unspecified osteoarthritis, unspecified site: Secondary | ICD-10-CM | POA: Insufficient documentation

## 2012-08-13 DIAGNOSIS — M549 Dorsalgia, unspecified: Secondary | ICD-10-CM | POA: Insufficient documentation

## 2012-08-13 DIAGNOSIS — S060XAA Concussion with loss of consciousness status unknown, initial encounter: Secondary | ICD-10-CM

## 2012-08-13 DIAGNOSIS — S0101XA Laceration without foreign body of scalp, initial encounter: Secondary | ICD-10-CM

## 2012-08-13 DIAGNOSIS — E119 Type 2 diabetes mellitus without complications: Secondary | ICD-10-CM | POA: Insufficient documentation

## 2012-08-13 DIAGNOSIS — S3600XA Unspecified injury of spleen, initial encounter: Secondary | ICD-10-CM

## 2012-08-13 DIAGNOSIS — G8929 Other chronic pain: Secondary | ICD-10-CM | POA: Insufficient documentation

## 2012-08-13 DIAGNOSIS — S32009A Unspecified fracture of unspecified lumbar vertebra, initial encounter for closed fracture: Secondary | ICD-10-CM

## 2012-08-13 DIAGNOSIS — S060X9A Concussion with loss of consciousness of unspecified duration, initial encounter: Secondary | ICD-10-CM

## 2012-08-13 LAB — CBC
HCT: 25.7 % — ABNORMAL LOW (ref 39.0–52.0)
Hemoglobin: 8.6 g/dL — ABNORMAL LOW (ref 13.0–17.0)
WBC: 9.5 10*3/uL (ref 4.0–10.5)

## 2012-08-13 MED ORDER — MENINGOCOCCAL VAC A,C,Y,W-135 ~~LOC~~ INJ
0.5000 mL | INJECTION | Freq: Once | SUBCUTANEOUS | Status: AC
  Administered 2012-08-13: 0.5 mL via SUBCUTANEOUS
  Filled 2012-08-13: qty 0.5

## 2012-08-13 MED ORDER — PNEUMOCOCCAL VAC POLYVALENT 25 MCG/0.5ML IJ INJ
0.5000 mL | INJECTION | INTRAMUSCULAR | Status: DC
  Filled 2012-08-13: qty 0.5

## 2012-08-13 MED ORDER — PNEUMOCOCCAL VAC POLYVALENT 25 MCG/0.5ML IJ INJ
0.5000 mL | INJECTION | INTRAMUSCULAR | Status: AC | PRN
  Administered 2012-08-13: 0.5 mL via INTRAMUSCULAR

## 2012-08-13 MED ORDER — HAEMOPHILUS B POLYSAC CONJ VAC IM SOLR
0.5000 mL | Freq: Once | INTRAMUSCULAR | Status: AC
  Administered 2012-08-13: 0.5 mL via INTRAMUSCULAR
  Filled 2012-08-13: qty 0.5

## 2012-08-13 MED ORDER — OXYCODONE-ACETAMINOPHEN 7.5-325 MG PO TABS: 1.0000 | ORAL_TABLET | ORAL | Status: AC | PRN

## 2012-08-13 NOTE — Discharge Summary (Signed)
Lakendrick Paradis, MD, MPH, FACS Pager: [phone]  

## 2012-08-13 NOTE — Progress Notes (Signed)
Patient discharged to home with instructions, verbalized understanding. 
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
## 2012-08-13 NOTE — Progress Notes (Signed)
Pt enrolled in [facility] program for medication assistance through the hospital.  Because of his indigent status, the $3 copay was waived for patient.

## 2012-08-13 NOTE — Progress Notes (Signed)
Agree with plan Violeta Gelinas, MD, MPH, FACS Pager: [phone]

## 2012-08-14 LAB — TYPE AND SCREEN
Unit division: 0
Unit division: 0
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
## 2016-10-24 ENCOUNTER — Emergency Department (HOSPITAL_COMMUNITY)
Admission: EM | Admit: 2016-10-24 | Discharge: 2016-10-24 | Disposition: A | Discharge status: Home / Self Care | Payer: Medicaid Other | Attending: Emergency Medicine | Admitting: Emergency Medicine

## 2016-10-24 ENCOUNTER — Encounter (HOSPITAL_COMMUNITY): Payer: Self-pay

## 2016-10-24 DIAGNOSIS — K429 Umbilical hernia without obstruction or gangrene: Secondary | ICD-10-CM | POA: Insufficient documentation

## 2016-10-24 DIAGNOSIS — B029 Zoster without complications: Secondary | ICD-10-CM | POA: Diagnosis not present

## 2016-10-24 DIAGNOSIS — Z79899 Other long term (current) drug therapy: Secondary | ICD-10-CM | POA: Diagnosis not present

## 2016-10-24 DIAGNOSIS — E119 Type 2 diabetes mellitus without complications: Secondary | ICD-10-CM | POA: Insufficient documentation

## 2016-10-24 DIAGNOSIS — I10 Essential (primary) hypertension: Secondary | ICD-10-CM | POA: Diagnosis not present

## 2016-10-24 DIAGNOSIS — R21 Rash and other nonspecific skin eruption: Secondary | ICD-10-CM | POA: Diagnosis present

## 2016-10-24 MED ORDER — LIDOCAINE VISCOUS 2 % MT SOLN: 15.0000 mL | Freq: Once | OROMUCOSAL | Status: DC

## 2016-10-24 MED ORDER — IBUPROFEN 600 MG PO TABS: 600.0000 mg | ORAL_TABLET | Freq: Four times a day (QID) | ORAL | 0 refills | Status: AC | PRN

## 2016-10-24 MED ORDER — LIDOCAINE 4 % EX GEL: 1.0000 "application " | Freq: Three times a day (TID) | CUTANEOUS | 0 refills | Status: AC | PRN

## 2016-10-24 MED ORDER — ACETAMINOPHEN 325 MG PO TABS: 650.0000 mg | ORAL_TABLET | Freq: Four times a day (QID) | ORAL | 0 refills | Status: AC | PRN

## 2016-10-24 MED ORDER — PREDNISONE 10 MG PO TABS: 60.0000 mg | ORAL_TABLET | Freq: Every day | ORAL | 0 refills | Status: AC

## 2016-10-24 MED ORDER — ACYCLOVIR 200 MG PO CAPS: 800.0000 mg | ORAL_CAPSULE | Freq: Once | ORAL | Status: DC

## 2016-10-24 MED ORDER — DEXAMETHASONE 4 MG PO TABS: 10.0000 mg | ORAL_TABLET | Freq: Once | ORAL | Status: DC

## 2016-10-24 MED ORDER — ACYCLOVIR 800 MG PO TABS: 800.0000 mg | ORAL_TABLET | Freq: Every day | ORAL | 0 refills | Status: AC

## 2016-10-24 NOTE — ED Triage Notes (Signed)
Pt presents for evaluation of rash, appears to be shingles to R rib cage. States noticed yesterday and thought it was a bug bite. Pt also has large umbilical hernia, told to come to hospital if it came back out.
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
## 2016-10-24 NOTE — ED Provider Notes (Signed)
[facility]-EMERGENCY DEPT Provider Note   CSN: [id number] Arrival date & time: 10/24/16  1240     History   Chief Complaint Chief Complaint  Patient presents with  . Herpes Zoster  . Hernia    HPI Jerry Roberson is a 66 y.o. male.  HPI  PT comes in with cc of rash to the R side of his back and chest. He noticed the rash 1 day ago and it has expanded since then. Pt describes the pain as sharp but tolerable. Pt has no n/v/f/c. Pt also reports that he wants to see someone for his hernia. Hernia in the abdomen has been present for years now, but it is staying out, sometimes difficult to reduce.   Past Medical History:  Diagnosis Date  . Arthritis   . Chronic back pain   . Diabetes mellitus without complication (HCC)   . Hypertension     Patient Active Problem List   Diagnosis Date Noted  . DM (diabetes mellitus) (HCC) 08/13/2012  . Lumbar transverse process fractures 08/13/2012  . Spleen injury 08/13/2012  . Concussion 08/13/2012  . Scalp laceration 08/13/2012  . Arthritis 08/13/2012  . Chronic back pain 08/13/2012  . Multiple fractures of ribs of left side 08/12/2012  . Near syncope 08/10/2012  . Acute blood loss anemia 08/10/2012  . HTN (hypertension) 08/10/2012  . Cocaine abuse 08/10/2012    History reviewed. No pertinent surgical history.     Home Medications    Prior to Admission medications   Medication Sig Start Date End Date Taking? Authorizing Provider  acetaminophen (TYLENOL) 325 MG tablet Take 2 tablets (650 mg total) by mouth every 6 (six) hours as needed. 10/24/16   Derwood Kaplan, MD  acyclovir (ZOVIRAX) 800 MG tablet Take 1 tablet (800 mg total) by mouth 5 (five) times daily. 10/24/16   Derwood Kaplan, MD  amLODipine (NORVASC) 5 MG tablet Take 5 mg by mouth daily.    [provider]  gabapentin (NEURONTIN) 300 MG capsule Take 300 mg by mouth 3 (three) times daily.    [provider]  ibuprofen (ADVIL,MOTRIN) 600 MG tablet Take 1  tablet (600 mg total) by mouth every 6 (six) hours as needed. 10/24/16   Derwood Kaplan, MD  Lidocaine 4 % GEL Apply 1 application topically 3 (three) times daily as needed. 10/24/16   Derwood Kaplan, MD  lisinopril (PRINIVIL,ZESTRIL) 20 MG tablet Take 1 tablet (20 mg total) by mouth daily. 12/30/11   Cheri Guppy, MD  NAPROXEN PO Take 1 tablet by mouth 2 (two) times daily with breakfast and lunch.    [provider]  oxyCODONE-acetaminophen (PERCOCET) 7.5-325 MG per tablet Take 1-2 tablets by mouth every 4 (four) hours as needed for pain. 08/13/12   Freeman Caldron, PA-C  predniSONE (DELTASONE) 10 MG tablet Take 6 tablets (60 mg total) by mouth daily. 10/24/16   Derwood Kaplan, MD  traMADol (ULTRAM) 50 MG tablet Take 50 mg by mouth every 6 (six) hours as needed for pain. 12/30/11   Cheri Guppy, MD    Family History No family history on file.  Social History Social History  Substance Use Topics  . Smoking status: Never Smoker  . Smokeless tobacco: Not on file  . Alcohol use No     Allergies   Patient has no known allergies.   Review of Systems Review of Systems  Constitutional: Negative for activity change and fever.  Eyes: Negative for photophobia.  Gastrointestinal: Positive for abdominal distention.  Musculoskeletal:  Positive for back pain.  Skin: Positive for rash.  Allergic/Immunologic: Negative for immunocompromised state.     Physical Exam Updated Vital Signs BP (!) 171/102 (BP Location: Right Arm)   Pulse 76   Temp 98 F (36.7 C) (Oral)   Resp 16   Ht 5\' 11"  (1.803 m)   Wt 77.1 kg (170 lb)   SpO2 100%   BMI 23.71 kg/m   Physical Exam  Constitutional: He is oriented to person, place, and time. He appears well-developed.  HENT:  Head: Atraumatic.  Neck: Neck supple.  Cardiovascular: Normal rate.   Pulmonary/Chest: Effort normal.  Abdominal: Soft. A hernia is present.  Reducible large hernia  Neurological: He is alert and oriented  to person, place, and time.  Skin: Skin is warm.  Nursing note and vitals reviewed.    ED Treatments / Results  Labs (all labs ordered are listed, but only abnormal results are displayed) Labs Reviewed - No data to display  EKG  EKG Interpretation None       Radiology No results found.  Procedures Procedures (including critical care time)  Medications Ordered in ED Medications - No data to display   Initial Impression / Assessment and Plan / ED Course  I have reviewed the triage vital signs and the nursing notes.  Pertinent labs & imaging results that were available during my care of the patient were reviewed by me and considered in my medical decision making (see chart for details).     Pt comes in with cc of rash, and he has a zoster infection. Will start on appropriate meds. Case management called.  Pt also has a hernia, which is reducible. Pt reports intermittent pain and the hernia not being reducible - so we will give him surgery consultation.  Final Clinical Impressions(s) / ED Diagnoses   Final diagnoses:  Herpes zoster without complication  Umbilical hernia without obstruction and without gangrene    New Prescriptions New Prescriptions   ACETAMINOPHEN (TYLENOL) 325 MG TABLET    Take 2 tablets (650 mg total) by mouth every 6 (six) hours as needed.   ACYCLOVIR (ZOVIRAX) 800 MG TABLET    Take 1 tablet (800 mg total) by mouth 5 (five) times daily.   IBUPROFEN (ADVIL,MOTRIN) 600 MG TABLET    Take 1 tablet (600 mg total) by mouth every 6 (six) hours as needed.   LIDOCAINE 4 % GEL    Apply 1 application topically 3 (three) times daily as needed.   PREDNISONE (DELTASONE) 10 MG TABLET    Take 6 tablets (60 mg total) by mouth daily.     Derwood Kaplan, MD 10/24/16 [phone]

## 2016-10-24 NOTE — Discharge Instructions (Signed)
Please take the medicine prescribed for zoster.  The hernia is stable right now, please see the Surgeon if it starts giving you more problems.

## 2018-05-28 ENCOUNTER — Telehealth: Payer: Self-pay

## 2018-05-28 NOTE — Congregational Nurse Program (Unsigned)
Pt was alert and oriented X 3, no complaints of pain, no signs of distress observed. Temp 97.7 resp 16. Pt states not going outside due to concerns of getting sick.

## 2018-05-28 NOTE — Telephone Encounter (Signed)
Called to schedule appt for Thursday at 12 noon.

## 2018-07-09 NOTE — Progress Notes (Signed)
COVID Hotel Screening performed. Temperature, PHQ-9, and need for medical care and medications assessed. No additional needs assessed at this time.  Manraj Yeo RN MSN 

## 2018-07-22 NOTE — Progress Notes (Signed)
COVID Hotel Screening performed. Temperature, PHQ-9, and need for medical care and medications assessed. Patient requests assistance with obtaining medications. Referral sent to Caroleen Hamman.  Carlyle Basques RN MSN

## 2018-07-29 NOTE — Progress Notes (Signed)
COVID Hotel Screening performed. Temperature, PHQ-9, and need for medical care and medications assessed. Patient states he is due for refills, but does not have the money to purchase them. He has Medicaid and Medicare. He is currently taking HCTZ 225mg  and Mobic 7.5mg . He gets his medications from [facility] on [address], but wants to transfer them to the [facility] on [address] next to the hotel. Referral sent to Caroleen Hamman.  Carlyle Basques RN MSN

## 2018-08-06 ENCOUNTER — Other Ambulatory Visit (HOSPITAL_COMMUNITY): Payer: Self-pay

## 2018-08-06 DIAGNOSIS — Z20822 Contact with and (suspected) exposure to covid-19: Secondary | ICD-10-CM

## 2018-08-07 ENCOUNTER — Other Ambulatory Visit: Payer: Self-pay

## 2018-08-07 DIAGNOSIS — Z20822 Contact with and (suspected) exposure to covid-19: Secondary | ICD-10-CM

## 2018-08-10 LAB — NOVEL CORONAVIRUS, NAA: SARS-CoV-2, NAA: NOT DETECTED

## 2018-08-10 NOTE — Progress Notes (Signed)
COVID Hotel Screening performed. COVID screening, temperature, and need for medical care and medications assessed. Patient agreed to the COVID-19 testing. No additional needs assessed at this time.  Emelio Schneller RN MSN 

## 2018-08-12 NOTE — Progress Notes (Signed)
COVID Hotel Screening performed. Temperature, PHQ-9, and need for medical care and medications assessed. No additional needs assessed at this time.  Jahden Schara  MSN, RN 

## 2018-08-19 NOTE — Progress Notes (Signed)
COVID Hotel Screening performed. COVID screening, temperature, PHQ-9, and need for medical care and medications assessed. No additional needs assessed at this time.  Fatisha Rabalais RN MSN 

## 2018-09-04 NOTE — Progress Notes (Signed)
[place] Screening performed. Temperature, PHQ-9, and need for medical care and medications assessed.  Pt referred for medication assistance.    Jobe Igo MSN, RN

## 2018-09-10 ENCOUNTER — Other Ambulatory Visit: Payer: Self-pay | Admitting: *Deleted

## 2018-09-10 DIAGNOSIS — Z20822 Contact with and (suspected) exposure to covid-19: Secondary | ICD-10-CM

## 2018-09-17 LAB — NOVEL CORONAVIRUS, NAA: SARS-CoV-2, NAA: NOT DETECTED

## 2018-10-30 NOTE — Progress Notes (Signed)
COVID-19 Screening performed. Temperature, PHQ-9, and need for medical care and medications assessed. No additional needs assessed at this time.  Dwyane Dupree MSN, RN 

## 2018-11-02 NOTE — Progress Notes (Signed)
COVID-19 Screening performed. Temperature, PHQ-9, and need for medical care and medications assessed. No additional needs assessed at this time.  Keyara Ent MSN, RN 

## 2018-11-24 NOTE — Progress Notes (Signed)
COVID-19 Screening performed. Temperature, PHQ-9, and need for medical care and medications assessed. No additional needs assessed at this time.  Kaiulani Sitton MSN, RN
